# Patient Record
Sex: Male | Born: 1995 | Race: White | Hispanic: No | Marital: Single
Health system: Southern US, Community
[De-identification: ages and names within clinical notes are randomized; demographics above are authoritative.]

---
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
## 2013-12-17 ENCOUNTER — Institutional Professional Consult (permissible substitution) (INDEPENDENT_AMBULATORY_CARE_PROVIDER_SITE_OTHER): Payer: 59 | Admitting: Family

## 2013-12-17 DIAGNOSIS — F909 Attention-deficit hyperactivity disorder, unspecified type: Secondary | ICD-10-CM

## 2020-03-11 ENCOUNTER — Ambulatory Visit: Payer: 59 | Attending: Internal Medicine

## 2020-03-11 DIAGNOSIS — Z23 Encounter for immunization: Secondary | ICD-10-CM

## 2020-03-11 NOTE — Progress Notes (Signed)
   Covid-[facility] Vaccination [facility]  Name:  Brendan Daniels    MRN: [id number] DOB: 08-27-1996  03/11/2020  [name] was observed post Covid-19 immunization for 15 minutes without incident. He was provided with Vaccine Information Sheet and instruction to access the V-Safe system.   [name] was instructed to call 911 with any severe reactions post vaccine: Marland Kitchen Difficulty breathing  . Swelling of face and throat  . A fast heartbeat  . A bad rash all over body  . Dizziness and weakness   Immunizations Administered    Name Date Dose VIS Date Route   Pfizer COVID-19 Vaccine 03/11/2020 11:23 AM 0.3 mL 11/21/2019 Intramuscular   Manufacturer: [employer], [employer]   Lot: [id number]   NDC: [address]-2

## 2020-04-07 ENCOUNTER — Ambulatory Visit: Payer: 59 | Attending: Internal Medicine

## 2020-04-07 DIAGNOSIS — Z23 Encounter for immunization: Secondary | ICD-10-CM

## 2020-04-07 NOTE — Progress Notes (Signed)
   Covid-19 Vaccination Clinic  Name:  Aldahir Litaker    MRN: [id number] DOB: 1996-06-27  04/07/2020  Mr. Shartzer was observed post Covid-19 immunization for 15 minutes without incident. He was provided with Vaccine Information Sheet and instruction to access the V-Safe system.   Mr. Barefoot was instructed to call 911 with any severe reactions post vaccine: Marland Kitchen Difficulty breathing  . Swelling of face and throat  . A fast heartbeat  . A bad rash all over body  . Dizziness and weakness   Immunizations Administered    Name Date Dose VIS Date Route   Pfizer COVID-19 Vaccine 04/07/2020  4:53 PM 0.3 mL 02/04/2019 Intramuscular   Manufacturer: [employer], [employer]   Lot: [id number]   NDC: [address]-2
